# Patient Record
Sex: Male | Born: 1980 | Race: Black or African American | Hispanic: No | Marital: Single | Smoking: Never smoker
Health system: Southern US, Community
[De-identification: ages and names within clinical notes are randomized; demographics above are authoritative.]

---

## 2003-04-15 ENCOUNTER — Emergency Department (HOSPITAL_COMMUNITY): Admission: EM | Admit: 2003-04-15 | Discharge: 2003-04-15 | Payer: Self-pay | Admitting: Emergency Medicine

## 2004-11-21 ENCOUNTER — Emergency Department (HOSPITAL_COMMUNITY): Admission: EM | Admit: 2004-11-21 | Discharge: 2004-11-21 | Payer: Self-pay | Admitting: Emergency Medicine

## 2007-03-31 ENCOUNTER — Emergency Department (HOSPITAL_COMMUNITY): Admission: EM | Admit: 2007-03-31 | Discharge: 2007-03-31 | Payer: Self-pay | Admitting: Family Medicine

## 2009-02-25 ENCOUNTER — Emergency Department (HOSPITAL_COMMUNITY): Admission: EM | Admit: 2009-02-25 | Discharge: 2009-02-26 | Payer: Self-pay | Admitting: Emergency Medicine

## 2011-10-29 ENCOUNTER — Emergency Department (HOSPITAL_COMMUNITY): Payer: Self-pay

## 2011-10-29 ENCOUNTER — Emergency Department (HOSPITAL_COMMUNITY)
Admission: EM | Admit: 2011-10-29 | Discharge: 2011-10-30 | Disposition: A | Discharge status: Home / Self Care | Payer: No Typology Code available for payment source | Attending: Emergency Medicine | Admitting: Emergency Medicine

## 2011-10-29 ENCOUNTER — Encounter (HOSPITAL_COMMUNITY): Payer: Self-pay | Admitting: Adult Health

## 2011-10-29 ENCOUNTER — Emergency Department (HOSPITAL_COMMUNITY): Payer: No Typology Code available for payment source

## 2011-10-29 DIAGNOSIS — S2020XA Contusion of thorax, unspecified, initial encounter: Secondary | ICD-10-CM | POA: Insufficient documentation

## 2011-10-29 DIAGNOSIS — IMO0002 Reserved for concepts with insufficient information to code with codable children: Secondary | ICD-10-CM | POA: Insufficient documentation

## 2011-10-29 DIAGNOSIS — S300XXA Contusion of lower back and pelvis, initial encounter: Secondary | ICD-10-CM

## 2011-10-29 DIAGNOSIS — S60512A Abrasion of left hand, initial encounter: Secondary | ICD-10-CM

## 2011-10-29 DIAGNOSIS — S301XXA Contusion of abdominal wall, initial encounter: Secondary | ICD-10-CM

## 2011-10-29 DIAGNOSIS — S60511A Abrasion of right hand, initial encounter: Secondary | ICD-10-CM

## 2011-10-29 DIAGNOSIS — S80812A Abrasion, left lower leg, initial encounter: Secondary | ICD-10-CM

## 2011-10-29 DIAGNOSIS — Y92410 Unspecified street and highway as the place of occurrence of the external cause: Secondary | ICD-10-CM | POA: Insufficient documentation

## 2011-10-29 LAB — CBC WITH DIFFERENTIAL/PLATELET
Eosinophils Relative: 1 % (ref 0–5)
HCT: 39.6 % (ref 39.0–52.0)
Lymphocytes Relative: 29 % (ref 12–46)
Lymphs Abs: 2.7 10*3/uL (ref 0.7–4.0)
MCV: 88.4 fL (ref 78.0–100.0)
Monocytes Absolute: 0.5 10*3/uL (ref 0.1–1.0)
Monocytes Relative: 6 % (ref 3–12)
RBC: 4.48 MIL/uL (ref 4.22–5.81)
WBC: 9.5 10*3/uL (ref 4.0–10.5)

## 2011-10-29 LAB — BASIC METABOLIC PANEL
CO2: 23 mEq/L (ref 19–32)
Calcium: 9.3 mg/dL (ref 8.4–10.5)
Creatinine, Ser: 1.29 mg/dL (ref 0.50–1.35)
Glucose, Bld: 173 mg/dL — ABNORMAL HIGH (ref 70–99)

## 2011-10-29 MED ORDER — MORPHINE SULFATE 4 MG/ML IJ SOLN
4.0000 mg | Freq: Once | INTRAMUSCULAR | Status: AC
  Administered 2011-10-29: 4 mg via INTRAVENOUS
  Filled 2011-10-29: qty 1

## 2011-10-29 MED ORDER — SODIUM CHLORIDE 0.9 % IV SOLN
Freq: Once | INTRAVENOUS | Status: AC
  Administered 2011-10-29: 21:00:00 via INTRAVENOUS

## 2011-10-29 MED ORDER — IOHEXOL 300 MG/ML  SOLN
100.0000 mL | Freq: Once | INTRAMUSCULAR | Status: AC | PRN
  Administered 2011-10-29: 100 mL via INTRAVENOUS

## 2011-10-29 MED ORDER — OXYCODONE-ACETAMINOPHEN 5-325 MG PO TABS: 1.0000 | ORAL_TABLET | Freq: Four times a day (QID) | ORAL | Status: AC | PRN

## 2011-10-29 MED ORDER — LIDOCAINE HCL 2 % EX GEL
Freq: Once | CUTANEOUS | Status: AC
  Administered 2011-10-29: 20 via TOPICAL
  Filled 2011-10-29 (×2): qty 20

## 2011-10-29 MED ORDER — CEPHALEXIN 500 MG PO CAPS: 500.0000 mg | ORAL_CAPSULE | Freq: Four times a day (QID) | ORAL | Status: AC

## 2011-10-29 NOTE — ED Notes (Signed)
The patient is AOx4 and comfortable with his discharge instructions.  His family is here to drive him home.
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
## 2011-10-29 NOTE — ED Notes (Signed)
Patient transported to X-ray and CT 
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
## 2015-07-20 ENCOUNTER — Encounter (HOSPITAL_COMMUNITY): Payer: Self-pay | Admitting: Emergency Medicine

## 2015-07-20 ENCOUNTER — Emergency Department (HOSPITAL_COMMUNITY)
Admission: EM | Admit: 2015-07-20 | Discharge: 2015-07-21 | Disposition: A | Discharge status: Home / Self Care | Payer: No Typology Code available for payment source | Attending: Emergency Medicine | Admitting: Emergency Medicine

## 2015-07-20 DIAGNOSIS — L03011 Cellulitis of right finger: Secondary | ICD-10-CM | POA: Insufficient documentation

## 2015-07-20 MED ORDER — LIDOCAINE HCL (PF) 1 % IJ SOLN
10.0000 mL | Freq: Once | INTRAMUSCULAR | Status: AC
  Administered 2015-07-20

## 2015-07-20 MED ORDER — LIDOCAINE HCL (PF) 1 % IJ SOLN
INTRAMUSCULAR | Status: AC
  Filled 2015-07-20: qty 30

## 2015-07-20 NOTE — ED Provider Notes (Signed)
CSN: [id number]     Arrival date & time 07/20/15  2143 History  By signing my name below, I, [facility] Washington, attest that this documentation has been prepared under the direction and in the presence of Laurence Spatesachel Morgan Little, MD. Electronically Signed: Randell PatientMarrissa Washington, ED Scribe. 07/21/2015. 1:10 PM.   Chief Complaint  Patient presents with  . Hand Pain   The history is provided by the patient. No language interpreter was used.   HPI Comments: Todd Hamilton is a 35 y.o. male who presents to the Emergency Department complaining of constant, mild, gradually worsening right ring finger pain around his nail bed onset 1 week ago. Pt states that he had a manicure 1 week ago and that pain began immediately following the manicurist clipping his nail too short. He reports redness and swelling for the past 2 days and white drainage from the lateral side of the fingernail today. Pain is worse with palpation. He notes similar symptoms in the past on his right hand that resolved with I&D in the ED. Denies any other symptoms currently. NKDA.  History reviewed. No pertinent past medical history. History reviewed. No pertinent past surgical history. History reviewed. No pertinent family history. Social History  Substance Use Topics  . Smoking status: Never Smoker   . Smokeless tobacco: None  . Alcohol Use: Yes     Comment: seldom    Review of Systems A complete 10 system review of systems was obtained and all systems are negative except as noted in the HPI and PMH.   Allergies  Review of patient's allergies indicates no known allergies.  Home Medications   Prior to Admission medications   Medication Sig Start Date End Date Taking? Authorizing Provider  clindamycin (CLEOCIN) 150 MG capsule Take 2 capsules (300 mg total) by mouth 3 (three) times daily. 07/21/15   Ambrose Finlandachel Morgan Little, MD   BP 144/97 mmHg  Pulse 90  Temp(Src) 98.3 F (36.8 C) (Oral)  Resp 18  Ht 5\' 11"  (1.803 m)  Wt 212 lb  (96.163 kg)  BMI 29.58 kg/m2  SpO2 98% Physical Exam  Constitutional: He is oriented to person, place, and time. He appears well-developed and well-nourished. No distress.  HENT:  Head: Normocephalic and atraumatic.  Moist mucous membranes  Eyes: Conjunctivae are normal.  Musculoskeletal: He exhibits edema and tenderness.  Swelling of distal right fourth finer around nail with TTP of tip of finger.  Neurological: He is alert and oriented to person, place, and time.  Normal sensation right hand. Fluent speech  Skin: Skin is warm and dry. There is erythema.  Tenderness and fluctuance along ulnar side of nail of right fourth finger with mild erythema surrounding nail.  Psychiatric: He has a normal mood and affect. Judgment normal.  Nursing note and vitals reviewed.   ED Course  Procedures   INCISION AND DRAINAGE Performed by: Ambrose Finlandachel Morgan Little Consent: Verbal consent obtained. Risks and benefits: risks, benefits and alternatives were discussed Type: paronychia  Body area: R 4th fingernail  Anesthesia: local infiltration  Incision was made with a scalpel. 10 blade  Local anesthetic: lidocaine 1% without epinephrine  Anesthetic total: 6 ml digital nerve block  Complexity: simple  Drainage: purulent  Drainage amount: scant  Bandage: bacitracin and bandaid  Patient tolerance: Patient tolerated the procedure well with no immediate complications.    DIAGNOSTIC STUDIES: Oxygen Saturation is 98% on RA, normal by my interpretation.    COORDINATION OF CARE: 11:42 PM Will return to perform I&D of  right ring finger. Discussed treatment plan with pt at bedside and pt agreed to plan.  1:10 AM Returned to perform I&D. Will prescribe clindamycin. Advised pt to take antibiotics if symptoms do not improve.  MDM   Final diagnoses:  Paronychia of finger, right   Patient with right fourth finger pain after manicure. On exam he had a paronychia involving the ulnar side of his  right fourth fingernail. Some erythema and swelling around the distal portion of his finger. Performed incision and drainage, see procedure note for details. Patient tolerated procedure well. Because of surrounding erythema, I gave the patient a prescription for clindamycin and instructed to begin tomorrow afternoon if he does not note improvement after warm water soaks. Return precautions including any worsening infection symptoms reviewed. Patient voiced understanding and was discharged in satisfactory condition.  I personally performed the services described in this documentation, which was scribed in my presence. The recorded information has been reviewed and is accurate.   Laurence Spates, MD 07/21/15 [phone]

## 2015-07-20 NOTE — ED Notes (Signed)
Pt states he had a manicure on Tuesday and now he has swelling and pain around the nail on his right hand ring finger

## 2015-07-21 MED ORDER — CLINDAMYCIN HCL 150 MG PO CAPS: 300.0000 mg | ORAL_CAPSULE | Freq: Three times a day (TID) | ORAL | Status: AC

## 2015-07-21 MED ORDER — BACITRACIN ZINC 500 UNIT/GM EX OINT
TOPICAL_OINTMENT | CUTANEOUS | Status: AC
  Administered 2015-07-21: 02:00:00
  Filled 2015-07-21: qty 0.9

## 2015-07-21 NOTE — Discharge Instructions (Signed)
Paronychia °Paronychia is an infection of the skin that surrounds a nail. It usually affects the skin around a fingernail, but it may also occur near a toenail. It often causes pain and swelling around the nail. This condition may come on suddenly or develop over a longer period. In some cases, a collection of pus (abscess) can form near or under the nail. Usually, paronychia is not serious and it clears up with treatment. °CAUSES °This condition may be caused by bacteria or fungi. It is commonly caused by either Streptococcus or Staphylococcus bacteria. The bacteria or fungi often cause the infection by getting into the affected area through an opening in the skin, such as a cut or a hangnail. °RISK FACTORS °This condition is more likely to develop in: °· People who get their hands wet often, such as those who work as dishwashers, bartenders, or nurses. °· People who bite their fingernails or suck their thumbs. °· People who trim their nails too short. °· People who have hangnails or injured fingertips. °· People who get manicures. °· People who have diabetes. °SYMPTOMS °Symptoms of this condition include: °· Redness and swelling of the skin near the nail. °· Tenderness around the nail when you touch the area. °· Pus-filled bumps under the cuticle. The cuticle is the skin at the base or sides of the nail. °· Fluid or pus under the nail. °· Throbbing pain in the area. °DIAGNOSIS °This condition is usually diagnosed with a physical exam. In some cases, a sample of pus may be taken from an abscess to be tested in a lab. This can help to determine what type of bacteria or fungi is causing the condition. °TREATMENT °Treatment for this condition depends on the cause and severity of the condition. If the condition is mild, it may clear up on its own in a few days. Your health care provider may recommend soaking the affected area in warm water a few times a day. When treatment is needed, the options may  include: °· Antibiotic medicine, if the condition is caused by a bacterial infection. °· Antifungal medicine, if the condition is caused by a fungal infection. °· Incision and drainage, if an abscess is present. In this procedure, the health care provider will cut open the abscess so the pus can drain out. °HOME CARE INSTRUCTIONS °· Soak the affected area in warm water if directed to do so by your health care provider. You may be told to do this for 20 minutes, 2-3 times a day. Keep the area dry in between soakings. °· Take medicines only as directed by your health care provider. °· If you were prescribed an antibiotic medicine, finish all of it even if you start to feel better. °· Keep the affected area clean. °· Do not try to drain a fluid-filled bump yourself. °· If you will be washing dishes or performing other tasks that require your hands to get wet, wear rubber gloves. You should also wear gloves if your hands might come in contact with irritating substances, such as cleaners or chemicals. °· Follow your health care provider's instructions about: °¨ Wound care. °¨ Bandage (dressing) changes and removal. °SEEK MEDICAL CARE IF: °· Your symptoms get worse or do not improve with treatment. °· You have a fever or chills. °· You have redness spreading from the affected area. °· You have continued or increased fluid, blood, or pus coming from the affected area. °· Your finger or knuckle becomes swollen or is difficult to move. °  °  This information is not intended to replace advice given to you by your health care provider. Make sure you discuss any questions you have with your health care provider. °  °Document Released: 08/29/2000 Document Revised: 07/20/2014 Document Reviewed: 02/10/2014 °[employer] Interactive Patient Education ©2016 [employer] ° °

## 2015-07-21 NOTE — ED Notes (Signed)
Patient was alert, oriented and stable upon discharge. RN went over AVS and patient had no further questions.  

## 2015-07-21 NOTE — ED Notes (Addendum)
MD at bedside for I+D

## 2022-03-19 DEATH — deceased
